# Patient Record
Sex: Male | Born: 1990 | Race: White | Hispanic: No | Marital: Single
Health system: Southern US, Community
[De-identification: ages and names within clinical notes are randomized; demographics above are authoritative.]

---

## 2021-02-17 DIAGNOSIS — X32XXXS Exposure to sunlight, sequela: Secondary | ICD-10-CM | POA: Diagnosis not present

## 2021-02-17 DIAGNOSIS — L814 Other melanin hyperpigmentation: Secondary | ICD-10-CM | POA: Diagnosis not present

## 2021-02-17 DIAGNOSIS — D229 Melanocytic nevi, unspecified: Secondary | ICD-10-CM | POA: Diagnosis not present

## 2021-02-17 DIAGNOSIS — L218 Other seborrheic dermatitis: Secondary | ICD-10-CM | POA: Diagnosis not present

## 2021-03-13 DIAGNOSIS — F4322 Adjustment disorder with anxiety: Secondary | ICD-10-CM | POA: Diagnosis not present

## 2021-04-06 DIAGNOSIS — Z8249 Family history of ischemic heart disease and other diseases of the circulatory system: Secondary | ICD-10-CM | POA: Diagnosis not present

## 2021-04-06 DIAGNOSIS — E7800 Pure hypercholesterolemia, unspecified: Secondary | ICD-10-CM | POA: Diagnosis not present

## 2021-04-06 DIAGNOSIS — Z13220 Encounter for screening for lipoid disorders: Secondary | ICD-10-CM | POA: Diagnosis not present

## 2021-04-06 DIAGNOSIS — Z0000 Encounter for general adult medical examination without abnormal findings: Secondary | ICD-10-CM | POA: Diagnosis not present

## 2021-04-06 DIAGNOSIS — Z87442 Personal history of urinary calculi: Secondary | ICD-10-CM | POA: Diagnosis not present

## 2021-04-06 DIAGNOSIS — Z79899 Other long term (current) drug therapy: Secondary | ICD-10-CM | POA: Diagnosis not present

## 2021-04-06 DIAGNOSIS — E669 Obesity, unspecified: Secondary | ICD-10-CM | POA: Diagnosis not present

## 2021-04-06 DIAGNOSIS — Z6831 Body mass index (BMI) 31.0-31.9, adult: Secondary | ICD-10-CM | POA: Diagnosis not present

## 2021-04-06 DIAGNOSIS — R61 Generalized hyperhidrosis: Secondary | ICD-10-CM | POA: Diagnosis not present

## 2021-04-06 DIAGNOSIS — N200 Calculus of kidney: Secondary | ICD-10-CM | POA: Diagnosis not present

## 2021-04-06 DIAGNOSIS — Z0001 Encounter for general adult medical examination with abnormal findings: Secondary | ICD-10-CM | POA: Diagnosis not present

## 2021-04-06 DIAGNOSIS — F4322 Adjustment disorder with anxiety: Secondary | ICD-10-CM | POA: Diagnosis not present

## 2021-04-06 DIAGNOSIS — R7989 Other specified abnormal findings of blood chemistry: Secondary | ICD-10-CM | POA: Diagnosis not present

## 2021-04-08 DIAGNOSIS — H6123 Impacted cerumen, bilateral: Secondary | ICD-10-CM | POA: Diagnosis not present

## 2021-04-16 DIAGNOSIS — R748 Abnormal levels of other serum enzymes: Secondary | ICD-10-CM | POA: Diagnosis not present

## 2021-07-01 DIAGNOSIS — H6123 Impacted cerumen, bilateral: Secondary | ICD-10-CM | POA: Diagnosis not present

## 2021-09-10 DIAGNOSIS — H6123 Impacted cerumen, bilateral: Secondary | ICD-10-CM | POA: Diagnosis not present

## 2021-10-28 DIAGNOSIS — H60313 Diffuse otitis externa, bilateral: Secondary | ICD-10-CM | POA: Diagnosis not present

## 2021-10-28 DIAGNOSIS — H6123 Impacted cerumen, bilateral: Secondary | ICD-10-CM | POA: Diagnosis not present

## 2021-10-28 DIAGNOSIS — R030 Elevated blood-pressure reading, without diagnosis of hypertension: Secondary | ICD-10-CM | POA: Diagnosis not present

## 2021-11-27 DIAGNOSIS — H6123 Impacted cerumen, bilateral: Secondary | ICD-10-CM | POA: Diagnosis not present

## 2021-12-02 DIAGNOSIS — F419 Anxiety disorder, unspecified: Secondary | ICD-10-CM | POA: Diagnosis not present

## 2021-12-10 DIAGNOSIS — F419 Anxiety disorder, unspecified: Secondary | ICD-10-CM | POA: Diagnosis not present

## 2021-12-17 DIAGNOSIS — F419 Anxiety disorder, unspecified: Secondary | ICD-10-CM | POA: Diagnosis not present

## 2021-12-24 DIAGNOSIS — F419 Anxiety disorder, unspecified: Secondary | ICD-10-CM | POA: Diagnosis not present

## 2021-12-31 DIAGNOSIS — F419 Anxiety disorder, unspecified: Secondary | ICD-10-CM | POA: Diagnosis not present

## 2022-01-07 DIAGNOSIS — K649 Unspecified hemorrhoids: Secondary | ICD-10-CM | POA: Diagnosis not present

## 2022-01-11 DIAGNOSIS — H6123 Impacted cerumen, bilateral: Secondary | ICD-10-CM | POA: Diagnosis not present

## 2022-02-03 ENCOUNTER — Encounter: Payer: Self-pay | Admitting: Family Medicine

## 2022-02-03 ENCOUNTER — Ambulatory Visit
Admission: RE | Admit: 2022-02-03 | Discharge: 2022-02-03 | Disposition: A | Discharge status: Home / Self Care | Payer: BC Managed Care – PPO | Source: Ambulatory Visit | Attending: Family Medicine | Admitting: Family Medicine

## 2022-02-03 ENCOUNTER — Ambulatory Visit: Payer: Self-pay

## 2022-02-03 ENCOUNTER — Ambulatory Visit (INDEPENDENT_AMBULATORY_CARE_PROVIDER_SITE_OTHER): Payer: BC Managed Care – PPO | Admitting: Family Medicine

## 2022-02-03 VITALS — BP 142/98 | Ht 72.0 in | Wt 230.0 lb

## 2022-02-03 DIAGNOSIS — M79644 Pain in right finger(s): Secondary | ICD-10-CM

## 2022-02-03 NOTE — Patient Instructions (Signed)
You have a fracture of the base of your 1st proximal phalanx (thumb). ?These take 4-6 weeks to heal. ?Wear the thumb spica brace at all times except to wash area, ice if needed. ?Tylenol, ibuprofen if needed. ?Follow up with me in 1 week for reevaluation, repeat ultrasound to ensure this hasn't shifted. ?Then typically would see you 3-4 weeks after that for final follow-up. ?

## 2022-02-03 NOTE — Progress Notes (Signed)
PCP: Pcp, No ? ?Subjective:  ? ?HPI: ?Patient is a 31 y.o. male here for right thumb injury. ? ?Patient reports he sustained an injury to his right thumb on Saturday but unsure how this occurred. ?Pain and swelling throughout right thumb. ?No bruising. ?He is right handed. ?Works with computer mainly. ?No prior injuries. ? ?History reviewed. No pertinent past medical history. ? ?No current outpatient medications on file prior to visit.  ? ?No current facility-administered medications on file prior to visit.  ? ? ?History reviewed. No pertinent surgical history. ? ?Allergies  ?Allergen Reactions  ? Penicillins   ? Sulfa Antibiotics   ? ? ?BP (!) 142/98   Ht 6' (1.829 m)   Wt 230 lb (104.3 kg)   BMI 31.19 kg/m?  ? ?No flowsheet data found. ? ?No flowsheet data found. ? ?    ?Objective:  ?Physical Exam: ? ?Gen: NAD, comfortable in exam room ? ?Right hand/thumb: ?Swelling throughout thumb but no bruising, malrotation, or angulation. ?FROM with 5/5 strength flexion and extension at IP and MCP joint of thumb. ?Tenderness to palpation proximal phalanx but none over 1st MCP UCL. ?Collateral ligaments intact at 1st MCP. ?NVI distally. ?  ?Limited msk u/s right thumb:  Cortical irregularity lateral proximal 1st phalanx without displacement.  Flexor and extensor tendons intact.   ?Assessment & Plan:  ?1. Right 1st digit injury - fracture proximal 1st phalanx seen on ultrasound - radiographs performed and confirmed though with minimal displacement.  No malrotation or angulation on exam.  Thumb spica brace.  Icing, tylenol or ibuprofen if needed.  F/u in 1 week to reevaluate, repeat ultrasound.  Expect 4-6 weeks total to heal. ?

## 2022-02-08 ENCOUNTER — Telehealth: Payer: Self-pay | Admitting: Family Medicine

## 2022-02-08 NOTE — Telephone Encounter (Signed)
-----   Message from [facility], LAT sent at 02/08/2022  8:33 AM EDT ----- ?Regarding: FW: phone message ? ?----- Message ----- ?From: Lizbeth Bark ?Sent: 02/08/2022   8:14 AM EDT ?To: Rutha Bouchard, LAT ?Subject: phone message                                 ? ?Pt called stating he is having a lot of thumb pain while working on his computer mouse for work. He States using his point finger puts too much pressure on his thumb which causes pain. He is asking for a call back to discuss maybe going out of work on short term disability.  ? ? ?

## 2022-02-08 NOTE — Telephone Encounter (Signed)
We can have him out of work for the next 2 weeks if necessary for his thumb fracture.  Ok to print letter and I can fill out any paperwork he needs as well. ?

## 2022-02-10 ENCOUNTER — Ambulatory Visit: Payer: Self-pay

## 2022-02-10 ENCOUNTER — Ambulatory Visit (INDEPENDENT_AMBULATORY_CARE_PROVIDER_SITE_OTHER): Payer: BC Managed Care – PPO | Admitting: Family Medicine

## 2022-02-10 VITALS — BP 130/86 | Ht 72.0 in | Wt 230.0 lb

## 2022-02-10 DIAGNOSIS — M79644 Pain in right finger(s): Secondary | ICD-10-CM

## 2022-02-10 NOTE — Patient Instructions (Signed)
Your fracture looks great on ultrasound - it hasn't shifted. ?Continue the thumb spica brace - ok to take this off to ice it if needed and once a day to wash the area. ?Wear the brace for 3 more weeks then follow up with me then. ?We will go over motion exercises as this will be stiff at that point. ?Tylenol, ibuprofen if needed. ?

## 2022-02-11 ENCOUNTER — Encounter: Payer: Self-pay | Admitting: Family Medicine

## 2022-02-11 NOTE — Progress Notes (Signed)
PCP: Pcp, No ? ?Subjective:  ? ?HPI: ?Patient is a 31 y.o. male here for right thumb injury. ? ?3/8: ?Patient reports he sustained an injury to his right thumb on Saturday but unsure how this occurred. ?Pain and swelling throughout right thumb. ?No bruising. ?He is right handed. ?Works with computer mainly. ?No prior injuries. ? ?3/15: ?Patient reports he's doing well. ?Wearing the thumb spica brace regularly. ?Some swelling beyond the brace. ?Not requiring medicine for this. ?Not bothering him while wearing the brace. ? ?History reviewed. No pertinent past medical history. ? ?No current outpatient medications on file prior to visit.  ? ?No current facility-administered medications on file prior to visit.  ? ? ?History reviewed. No pertinent surgical history. ? ?Allergies  ?Allergen Reactions  ? Penicillins   ? Sulfa Antibiotics   ? ? ?BP 130/86   Ht 6' (1.829 m)   Wt 230 lb (104.3 kg)   BMI 31.19 kg/m?  ? ?No flowsheet data found. ? ?No flowsheet data found. ? ?    ?Objective:  ?Physical Exam: ? ?Gen: NAD, comfortable in exam room ? ?Right hand/thumb: ?Minimal swelling.  No bruising, malrotation or angulation. ?Mild limitation flexion of thumb IP and MCP joint, full extension. ?Minimal tenderness proximal phalanx base. ?NVI distally. ? ?Limited MSK u/s right thumb: cortical irregularity again visualized lateral proximal 1st phalanx again noted without displacement. ? ?Assessment & Plan:  ?1. Right 1st digit injury - Fracture proximal 1st phalanx without displacement - unchanged on today's ultrasound and clinically healing.  Continue thumb spica brace for 3 more weeks.  Tylenol, ibuprofen if needed.  F/u in 3 weeks - plan to discontinue brace, work on motion at that time. ?

## 2022-03-01 ENCOUNTER — Ambulatory Visit: Payer: BC Managed Care – PPO | Admitting: Family Medicine

## 2022-03-18 DIAGNOSIS — H6123 Impacted cerumen, bilateral: Secondary | ICD-10-CM | POA: Diagnosis not present

## 2022-04-01 DIAGNOSIS — F419 Anxiety disorder, unspecified: Secondary | ICD-10-CM | POA: Diagnosis not present

## 2022-04-01 IMAGING — CR DG FINGER THUMB 2+V*R*
3 series · 3 of 3 positions shown · non-contrast
Comparison: None

CLINICAL DATA: RIGHT thumb pain, no known injury

EXAM:
RIGHT THUMB 2+V

[x finger pa right]
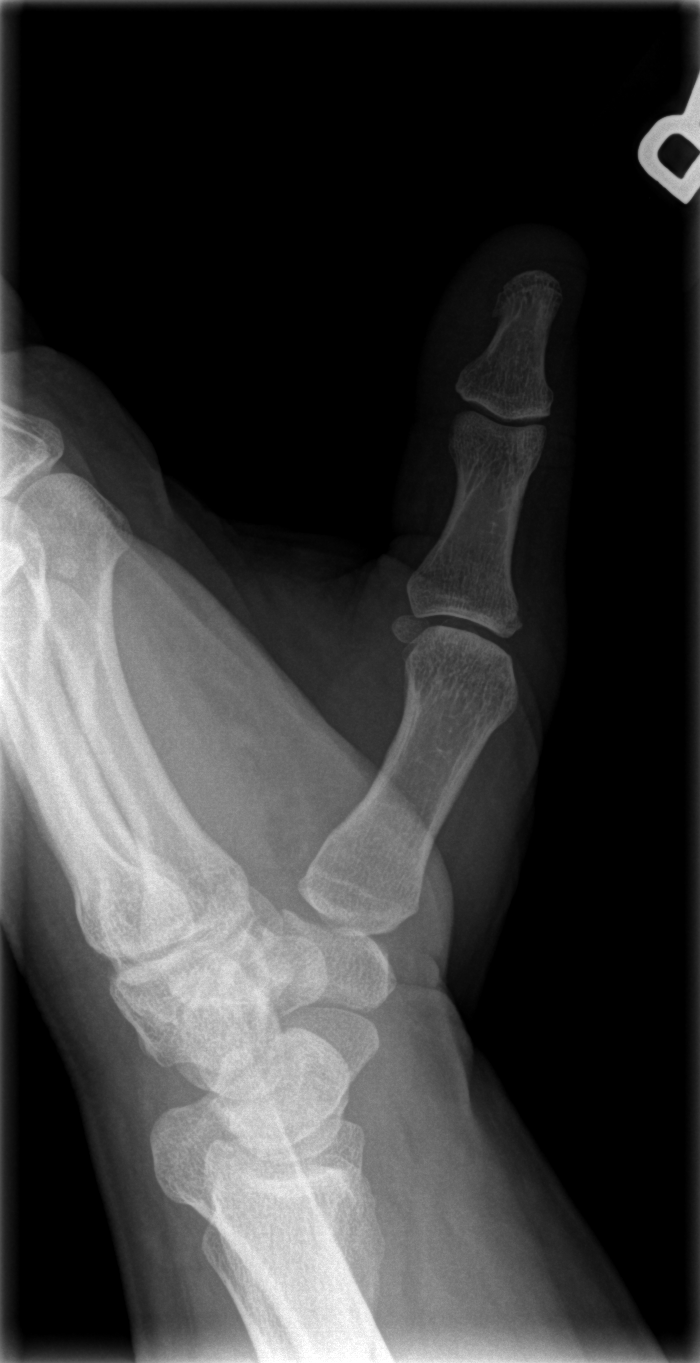

[x finger obl. right]
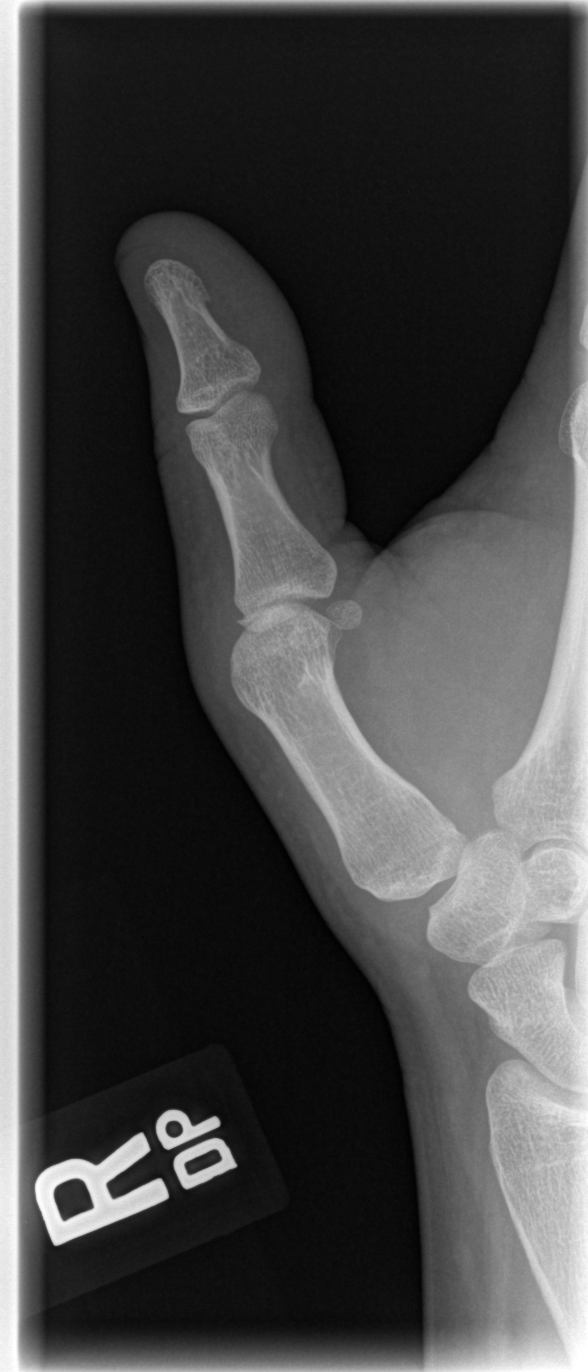

[x finger lateral right]
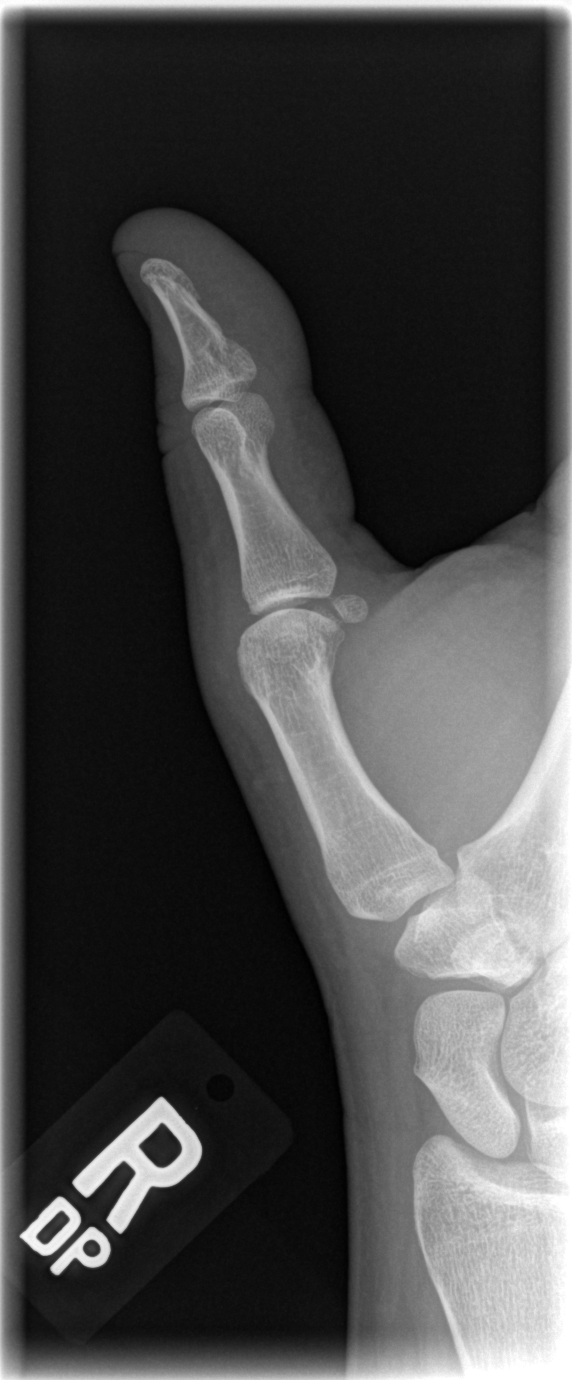

[3 of 3 positions shown; findings below may reference images not displayed]

FINDINGS: Osseous mineralization normal.

Joint spaces preserved.

Bone irregularity identified at the radial aspect base of proximal
phalanx RIGHT thumb, on oblique few demonstrating an acute margin,
question subtle nondisplaced avulsion fracture.

No additional fracture, dislocation, or bone destruction.
IMPRESSION: Suspected avulsion fracture at radial aspect base of proximal
phalanx RIGHT thumb.

## 2022-05-06 DIAGNOSIS — F419 Anxiety disorder, unspecified: Secondary | ICD-10-CM | POA: Diagnosis not present

## 2022-05-26 DIAGNOSIS — D1801 Hemangioma of skin and subcutaneous tissue: Secondary | ICD-10-CM | POA: Diagnosis not present

## 2022-05-26 DIAGNOSIS — L74510 Primary focal hyperhidrosis, axilla: Secondary | ICD-10-CM | POA: Diagnosis not present

## 2022-05-26 DIAGNOSIS — D225 Melanocytic nevi of trunk: Secondary | ICD-10-CM | POA: Diagnosis not present

## 2022-05-26 DIAGNOSIS — L578 Other skin changes due to chronic exposure to nonionizing radiation: Secondary | ICD-10-CM | POA: Diagnosis not present

## 2022-07-20 DIAGNOSIS — H6123 Impacted cerumen, bilateral: Secondary | ICD-10-CM | POA: Diagnosis not present

## 2022-12-09 DIAGNOSIS — H6123 Impacted cerumen, bilateral: Secondary | ICD-10-CM | POA: Diagnosis not present

## 2022-12-23 DIAGNOSIS — F1020 Alcohol dependence, uncomplicated: Secondary | ICD-10-CM | POA: Diagnosis not present

## 2022-12-24 DIAGNOSIS — F1020 Alcohol dependence, uncomplicated: Secondary | ICD-10-CM | POA: Diagnosis not present

## 2022-12-27 DIAGNOSIS — F1020 Alcohol dependence, uncomplicated: Secondary | ICD-10-CM | POA: Diagnosis not present

## 2022-12-29 DIAGNOSIS — R61 Generalized hyperhidrosis: Secondary | ICD-10-CM | POA: Diagnosis not present

## 2022-12-29 DIAGNOSIS — F411 Generalized anxiety disorder: Secondary | ICD-10-CM | POA: Diagnosis not present

## 2022-12-29 DIAGNOSIS — F1220 Cannabis dependence, uncomplicated: Secondary | ICD-10-CM | POA: Diagnosis not present

## 2022-12-29 DIAGNOSIS — F1020 Alcohol dependence, uncomplicated: Secondary | ICD-10-CM | POA: Diagnosis not present

## 2022-12-30 DIAGNOSIS — F1020 Alcohol dependence, uncomplicated: Secondary | ICD-10-CM | POA: Diagnosis not present

## 2022-12-31 DIAGNOSIS — F411 Generalized anxiety disorder: Secondary | ICD-10-CM | POA: Diagnosis not present

## 2022-12-31 DIAGNOSIS — F1220 Cannabis dependence, uncomplicated: Secondary | ICD-10-CM | POA: Diagnosis not present

## 2022-12-31 DIAGNOSIS — R61 Generalized hyperhidrosis: Secondary | ICD-10-CM | POA: Diagnosis not present

## 2022-12-31 DIAGNOSIS — F1020 Alcohol dependence, uncomplicated: Secondary | ICD-10-CM | POA: Diagnosis not present

## 2023-01-02 DIAGNOSIS — F1020 Alcohol dependence, uncomplicated: Secondary | ICD-10-CM | POA: Diagnosis not present

## 2023-01-03 DIAGNOSIS — F1020 Alcohol dependence, uncomplicated: Secondary | ICD-10-CM | POA: Diagnosis not present

## 2023-01-04 DIAGNOSIS — F1020 Alcohol dependence, uncomplicated: Secondary | ICD-10-CM | POA: Diagnosis not present

## 2023-01-05 DIAGNOSIS — F1020 Alcohol dependence, uncomplicated: Secondary | ICD-10-CM | POA: Diagnosis not present

## 2023-01-06 DIAGNOSIS — F1020 Alcohol dependence, uncomplicated: Secondary | ICD-10-CM | POA: Diagnosis not present

## 2023-01-07 DIAGNOSIS — F1020 Alcohol dependence, uncomplicated: Secondary | ICD-10-CM | POA: Diagnosis not present

## 2023-01-09 DIAGNOSIS — F1020 Alcohol dependence, uncomplicated: Secondary | ICD-10-CM | POA: Diagnosis not present

## 2023-01-10 DIAGNOSIS — F1020 Alcohol dependence, uncomplicated: Secondary | ICD-10-CM | POA: Diagnosis not present

## 2023-01-11 DIAGNOSIS — F1020 Alcohol dependence, uncomplicated: Secondary | ICD-10-CM | POA: Diagnosis not present

## 2023-01-12 DIAGNOSIS — F1020 Alcohol dependence, uncomplicated: Secondary | ICD-10-CM | POA: Diagnosis not present

## 2023-01-13 DIAGNOSIS — F1020 Alcohol dependence, uncomplicated: Secondary | ICD-10-CM | POA: Diagnosis not present

## 2023-01-14 DIAGNOSIS — F1020 Alcohol dependence, uncomplicated: Secondary | ICD-10-CM | POA: Diagnosis not present

## 2023-01-16 DIAGNOSIS — F1020 Alcohol dependence, uncomplicated: Secondary | ICD-10-CM | POA: Diagnosis not present

## 2023-01-17 DIAGNOSIS — F1020 Alcohol dependence, uncomplicated: Secondary | ICD-10-CM | POA: Diagnosis not present

## 2023-01-18 DIAGNOSIS — F1020 Alcohol dependence, uncomplicated: Secondary | ICD-10-CM | POA: Diagnosis not present

## 2023-01-19 DIAGNOSIS — F1020 Alcohol dependence, uncomplicated: Secondary | ICD-10-CM | POA: Diagnosis not present

## 2023-01-20 DIAGNOSIS — F1020 Alcohol dependence, uncomplicated: Secondary | ICD-10-CM | POA: Diagnosis not present

## 2023-01-21 DIAGNOSIS — F1020 Alcohol dependence, uncomplicated: Secondary | ICD-10-CM | POA: Diagnosis not present

## 2023-01-23 DIAGNOSIS — F1020 Alcohol dependence, uncomplicated: Secondary | ICD-10-CM | POA: Diagnosis not present

## 2023-01-24 DIAGNOSIS — F1020 Alcohol dependence, uncomplicated: Secondary | ICD-10-CM | POA: Diagnosis not present

## 2023-01-25 DIAGNOSIS — F1020 Alcohol dependence, uncomplicated: Secondary | ICD-10-CM | POA: Diagnosis not present

## 2023-01-26 DIAGNOSIS — F1020 Alcohol dependence, uncomplicated: Secondary | ICD-10-CM | POA: Diagnosis not present

## 2023-01-27 DIAGNOSIS — F1020 Alcohol dependence, uncomplicated: Secondary | ICD-10-CM | POA: Diagnosis not present

## 2023-01-28 DIAGNOSIS — F1020 Alcohol dependence, uncomplicated: Secondary | ICD-10-CM | POA: Diagnosis not present

## 2023-01-31 DIAGNOSIS — F1020 Alcohol dependence, uncomplicated: Secondary | ICD-10-CM | POA: Diagnosis not present

## 2023-02-01 DIAGNOSIS — F1020 Alcohol dependence, uncomplicated: Secondary | ICD-10-CM | POA: Diagnosis not present

## 2023-02-02 DIAGNOSIS — F1020 Alcohol dependence, uncomplicated: Secondary | ICD-10-CM | POA: Diagnosis not present

## 2023-02-03 DIAGNOSIS — F1020 Alcohol dependence, uncomplicated: Secondary | ICD-10-CM | POA: Diagnosis not present

## 2023-02-04 DIAGNOSIS — H6060 Unspecified chronic otitis externa, unspecified ear: Secondary | ICD-10-CM | POA: Diagnosis not present

## 2023-02-04 DIAGNOSIS — H6120 Impacted cerumen, unspecified ear: Secondary | ICD-10-CM | POA: Diagnosis not present

## 2023-02-04 DIAGNOSIS — J309 Allergic rhinitis, unspecified: Secondary | ICD-10-CM | POA: Diagnosis not present

## 2023-02-04 DIAGNOSIS — F1020 Alcohol dependence, uncomplicated: Secondary | ICD-10-CM | POA: Diagnosis not present

## 2023-02-07 DIAGNOSIS — F1020 Alcohol dependence, uncomplicated: Secondary | ICD-10-CM | POA: Diagnosis not present

## 2023-02-08 DIAGNOSIS — F1020 Alcohol dependence, uncomplicated: Secondary | ICD-10-CM | POA: Diagnosis not present

## 2023-02-09 DIAGNOSIS — F1020 Alcohol dependence, uncomplicated: Secondary | ICD-10-CM | POA: Diagnosis not present

## 2023-02-10 DIAGNOSIS — F1020 Alcohol dependence, uncomplicated: Secondary | ICD-10-CM | POA: Diagnosis not present

## 2023-02-11 DIAGNOSIS — F1020 Alcohol dependence, uncomplicated: Secondary | ICD-10-CM | POA: Diagnosis not present

## 2023-02-14 DIAGNOSIS — F1020 Alcohol dependence, uncomplicated: Secondary | ICD-10-CM | POA: Diagnosis not present

## 2023-02-14 DIAGNOSIS — F411 Generalized anxiety disorder: Secondary | ICD-10-CM | POA: Diagnosis not present

## 2023-02-14 DIAGNOSIS — R61 Generalized hyperhidrosis: Secondary | ICD-10-CM | POA: Diagnosis not present

## 2023-02-14 DIAGNOSIS — F1220 Cannabis dependence, uncomplicated: Secondary | ICD-10-CM | POA: Diagnosis not present

## 2023-02-16 DIAGNOSIS — F1020 Alcohol dependence, uncomplicated: Secondary | ICD-10-CM | POA: Diagnosis not present

## 2023-02-18 DIAGNOSIS — F1020 Alcohol dependence, uncomplicated: Secondary | ICD-10-CM | POA: Diagnosis not present

## 2023-02-21 DIAGNOSIS — F1020 Alcohol dependence, uncomplicated: Secondary | ICD-10-CM | POA: Diagnosis not present

## 2023-02-23 DIAGNOSIS — F1020 Alcohol dependence, uncomplicated: Secondary | ICD-10-CM | POA: Diagnosis not present

## 2023-02-25 DIAGNOSIS — F1020 Alcohol dependence, uncomplicated: Secondary | ICD-10-CM | POA: Diagnosis not present

## 2023-02-28 DIAGNOSIS — J309 Allergic rhinitis, unspecified: Secondary | ICD-10-CM | POA: Diagnosis not present

## 2023-02-28 DIAGNOSIS — H6060 Unspecified chronic otitis externa, unspecified ear: Secondary | ICD-10-CM | POA: Diagnosis not present

## 2023-02-28 DIAGNOSIS — H6120 Impacted cerumen, unspecified ear: Secondary | ICD-10-CM | POA: Diagnosis not present

## 2023-02-28 DIAGNOSIS — F1020 Alcohol dependence, uncomplicated: Secondary | ICD-10-CM | POA: Diagnosis not present

## 2023-03-02 DIAGNOSIS — F1020 Alcohol dependence, uncomplicated: Secondary | ICD-10-CM | POA: Diagnosis not present

## 2023-03-07 DIAGNOSIS — F1020 Alcohol dependence, uncomplicated: Secondary | ICD-10-CM | POA: Diagnosis not present

## 2023-03-09 DIAGNOSIS — F1020 Alcohol dependence, uncomplicated: Secondary | ICD-10-CM | POA: Diagnosis not present

## 2023-03-11 DIAGNOSIS — F1020 Alcohol dependence, uncomplicated: Secondary | ICD-10-CM | POA: Diagnosis not present

## 2023-03-14 DIAGNOSIS — F1020 Alcohol dependence, uncomplicated: Secondary | ICD-10-CM | POA: Diagnosis not present

## 2023-03-14 DIAGNOSIS — H6060 Unspecified chronic otitis externa, unspecified ear: Secondary | ICD-10-CM | POA: Diagnosis not present

## 2023-03-14 DIAGNOSIS — H6120 Impacted cerumen, unspecified ear: Secondary | ICD-10-CM | POA: Diagnosis not present

## 2023-03-14 DIAGNOSIS — J309 Allergic rhinitis, unspecified: Secondary | ICD-10-CM | POA: Diagnosis not present

## 2023-03-16 DIAGNOSIS — F1020 Alcohol dependence, uncomplicated: Secondary | ICD-10-CM | POA: Diagnosis not present

## 2023-03-18 DIAGNOSIS — F411 Generalized anxiety disorder: Secondary | ICD-10-CM | POA: Diagnosis not present

## 2023-03-18 DIAGNOSIS — F1020 Alcohol dependence, uncomplicated: Secondary | ICD-10-CM | POA: Diagnosis not present

## 2023-03-18 DIAGNOSIS — F1220 Cannabis dependence, uncomplicated: Secondary | ICD-10-CM | POA: Diagnosis not present

## 2023-03-18 DIAGNOSIS — R61 Generalized hyperhidrosis: Secondary | ICD-10-CM | POA: Diagnosis not present

## 2023-03-21 DIAGNOSIS — F1220 Cannabis dependence, uncomplicated: Secondary | ICD-10-CM | POA: Diagnosis not present

## 2023-03-21 DIAGNOSIS — F1020 Alcohol dependence, uncomplicated: Secondary | ICD-10-CM | POA: Diagnosis not present

## 2023-03-21 DIAGNOSIS — F411 Generalized anxiety disorder: Secondary | ICD-10-CM | POA: Diagnosis not present

## 2023-03-21 DIAGNOSIS — R61 Generalized hyperhidrosis: Secondary | ICD-10-CM | POA: Diagnosis not present

## 2023-03-23 DIAGNOSIS — F1020 Alcohol dependence, uncomplicated: Secondary | ICD-10-CM | POA: Diagnosis not present

## 2023-03-25 DIAGNOSIS — F1020 Alcohol dependence, uncomplicated: Secondary | ICD-10-CM | POA: Diagnosis not present

## 2023-03-30 DIAGNOSIS — H6060 Unspecified chronic otitis externa, unspecified ear: Secondary | ICD-10-CM | POA: Diagnosis not present

## 2023-03-30 DIAGNOSIS — H6120 Impacted cerumen, unspecified ear: Secondary | ICD-10-CM | POA: Diagnosis not present

## 2023-03-30 DIAGNOSIS — J309 Allergic rhinitis, unspecified: Secondary | ICD-10-CM | POA: Diagnosis not present

## 2023-04-18 DIAGNOSIS — R5383 Other fatigue: Secondary | ICD-10-CM | POA: Diagnosis not present

## 2023-04-18 DIAGNOSIS — Z131 Encounter for screening for diabetes mellitus: Secondary | ICD-10-CM | POA: Diagnosis not present

## 2023-04-18 DIAGNOSIS — F1021 Alcohol dependence, in remission: Secondary | ICD-10-CM | POA: Diagnosis not present

## 2023-04-18 DIAGNOSIS — Z13220 Encounter for screening for lipoid disorders: Secondary | ICD-10-CM | POA: Diagnosis not present

## 2023-04-18 DIAGNOSIS — Z1159 Encounter for screening for other viral diseases: Secondary | ICD-10-CM | POA: Diagnosis not present

## 2023-04-18 DIAGNOSIS — Z23 Encounter for immunization: Secondary | ICD-10-CM | POA: Diagnosis not present

## 2023-04-18 DIAGNOSIS — R748 Abnormal levels of other serum enzymes: Secondary | ICD-10-CM | POA: Diagnosis not present

## 2023-04-18 DIAGNOSIS — Z130 Encounter for screening for diseases of the blood and blood-forming organs and certain disorders involving the immune mechanism: Secondary | ICD-10-CM | POA: Diagnosis not present

## 2023-04-18 DIAGNOSIS — Z720 Tobacco use: Secondary | ICD-10-CM | POA: Diagnosis not present

## 2023-04-18 DIAGNOSIS — R946 Abnormal results of thyroid function studies: Secondary | ICD-10-CM | POA: Diagnosis not present

## 2023-04-18 DIAGNOSIS — Z1331 Encounter for screening for depression: Secondary | ICD-10-CM | POA: Diagnosis not present

## 2023-05-25 DIAGNOSIS — Z720 Tobacco use: Secondary | ICD-10-CM | POA: Diagnosis not present

## 2023-05-25 DIAGNOSIS — K649 Unspecified hemorrhoids: Secondary | ICD-10-CM | POA: Diagnosis not present

## 2023-05-25 DIAGNOSIS — Y93C9 Activity, other involving computer technology and electronic devices: Secondary | ICD-10-CM | POA: Diagnosis not present

## 2023-06-06 DIAGNOSIS — K649 Unspecified hemorrhoids: Secondary | ICD-10-CM | POA: Diagnosis not present

## 2023-06-27 DIAGNOSIS — H6122 Impacted cerumen, left ear: Secondary | ICD-10-CM | POA: Diagnosis not present

## 2023-06-27 DIAGNOSIS — J309 Allergic rhinitis, unspecified: Secondary | ICD-10-CM | POA: Diagnosis not present

## 2023-06-27 DIAGNOSIS — H6062 Unspecified chronic otitis externa, left ear: Secondary | ICD-10-CM | POA: Diagnosis not present

## 2023-07-29 DIAGNOSIS — F17290 Nicotine dependence, other tobacco product, uncomplicated: Secondary | ICD-10-CM | POA: Diagnosis not present

## 2023-07-29 DIAGNOSIS — K649 Unspecified hemorrhoids: Secondary | ICD-10-CM | POA: Diagnosis not present

## 2023-07-29 DIAGNOSIS — K648 Other hemorrhoids: Secondary | ICD-10-CM | POA: Diagnosis not present

## 2023-07-29 DIAGNOSIS — K644 Residual hemorrhoidal skin tags: Secondary | ICD-10-CM | POA: Diagnosis not present

## 2023-08-04 DIAGNOSIS — H6060 Unspecified chronic otitis externa, unspecified ear: Secondary | ICD-10-CM | POA: Diagnosis not present

## 2023-08-04 DIAGNOSIS — H60339 Swimmer's ear, unspecified ear: Secondary | ICD-10-CM | POA: Diagnosis not present

## 2023-08-04 DIAGNOSIS — H6120 Impacted cerumen, unspecified ear: Secondary | ICD-10-CM | POA: Diagnosis not present

## 2023-08-04 DIAGNOSIS — H938X9 Other specified disorders of ear, unspecified ear: Secondary | ICD-10-CM | POA: Diagnosis not present

## 2023-10-17 DIAGNOSIS — R61 Generalized hyperhidrosis: Secondary | ICD-10-CM | POA: Diagnosis not present

## 2023-10-17 DIAGNOSIS — F1021 Alcohol dependence, in remission: Secondary | ICD-10-CM | POA: Diagnosis not present

## 2023-10-17 DIAGNOSIS — F419 Anxiety disorder, unspecified: Secondary | ICD-10-CM | POA: Diagnosis not present

## 2023-10-17 DIAGNOSIS — Z0000 Encounter for general adult medical examination without abnormal findings: Secondary | ICD-10-CM | POA: Diagnosis not present

## 2023-11-07 DIAGNOSIS — J309 Allergic rhinitis, unspecified: Secondary | ICD-10-CM | POA: Diagnosis not present

## 2023-11-07 DIAGNOSIS — H6120 Impacted cerumen, unspecified ear: Secondary | ICD-10-CM | POA: Diagnosis not present

## 2023-11-07 DIAGNOSIS — H938X9 Other specified disorders of ear, unspecified ear: Secondary | ICD-10-CM | POA: Diagnosis not present

## 2023-11-07 DIAGNOSIS — H6060 Unspecified chronic otitis externa, unspecified ear: Secondary | ICD-10-CM | POA: Diagnosis not present
